# Patient Record
Sex: Female | Born: 1965 | Race: Black or African American | Hispanic: No | Marital: Married | Smoking: Never smoker
Health system: Southern US, Community
[De-identification: ages and names within clinical notes are randomized; demographics above are authoritative.]

---

## 2016-12-23 ENCOUNTER — Emergency Department (HOSPITAL_COMMUNITY): Payer: Worker's Compensation

## 2016-12-23 ENCOUNTER — Encounter (HOSPITAL_COMMUNITY): Payer: Self-pay | Admitting: *Deleted

## 2016-12-23 ENCOUNTER — Emergency Department (HOSPITAL_COMMUNITY)
Admission: EM | Admit: 2016-12-23 | Discharge: 2016-12-23 | Disposition: A | Discharge status: Home / Self Care | Payer: Worker's Compensation | Attending: Emergency Medicine | Admitting: Emergency Medicine

## 2016-12-23 DIAGNOSIS — Y92007 Garden or yard of unspecified non-institutional (private) residence as the place of occurrence of the external cause: Secondary | ICD-10-CM | POA: Insufficient documentation

## 2016-12-23 DIAGNOSIS — W0110XA Fall on same level from slipping, tripping and stumbling with subsequent striking against unspecified object, initial encounter: Secondary | ICD-10-CM | POA: Insufficient documentation

## 2016-12-23 DIAGNOSIS — Y939 Activity, unspecified: Secondary | ICD-10-CM | POA: Diagnosis not present

## 2016-12-23 DIAGNOSIS — S39012A Strain of muscle, fascia and tendon of lower back, initial encounter: Secondary | ICD-10-CM | POA: Diagnosis not present

## 2016-12-23 DIAGNOSIS — S161XXA Strain of muscle, fascia and tendon at neck level, initial encounter: Secondary | ICD-10-CM | POA: Insufficient documentation

## 2016-12-23 DIAGNOSIS — Y990 Civilian activity done for income or pay: Secondary | ICD-10-CM | POA: Diagnosis not present

## 2016-12-23 DIAGNOSIS — W19XXXA Unspecified fall, initial encounter: Secondary | ICD-10-CM

## 2016-12-23 LAB — POC URINE PREG, ED: PREG TEST UR: NEGATIVE

## 2016-12-23 MED ORDER — CYCLOBENZAPRINE HCL 10 MG PO TABS: ORAL_TABLET | ORAL | 0 refills | Status: DC

## 2016-12-23 MED ORDER — IBUPROFEN 600 MG PO TABS: 600.0000 mg | ORAL_TABLET | Freq: Four times a day (QID) | ORAL | 0 refills | Status: DC

## 2016-12-23 NOTE — ED Triage Notes (Signed)
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 Pt reports she fell backwards onto her left side. Denies LOC upon fall. Pt c/o soreness to left arm and left side.

## 2016-12-23 NOTE — ED Provider Notes (Signed)
[facility]-EMERGENCY DEPT Provider Note   CSN: [id number] Arrival date & time: 12/23/16  1135     History   Chief Complaint Chief Complaint  Patient presents with  . Fall    HPI Eileen Craig is a 51 y.o. female.  Patient is a 51 year old female who presents to the emergency department with a complaint of left neck and arm pain, and lower back pain. 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 She was able to break her fall with her arms extended. She noted however pain in her neck area in her left arm and in her lower back. The pain got worse during the night. Was not responding very well to conservative measures. She came to the emergency department today for additional evaluation and management. There was no loss of bowel or bladder function. No loss of balance Patient complains of some tinglingin the left arm, but she is not dropping objects and has very little difficulty using the left upper extremity.   The history is provided by the patient.  Fall  Pertinent negatives include no chest pain, no abdominal pain and no shortness of breath.    History reviewed. No pertinent past medical history.  There are no active problems to display for this patient.   History reviewed. No pertinent surgical history.  OB History    No data available       Home Medications    Prior to Admission medications   Not on File    Family History No family history on file.  Social History Social History  Substance Use Topics  . Smoking status: Never Smoker  . Smokeless tobacco: Never Used  . Alcohol use No     Allergies   Patient has no known allergies.   Review of Systems Review of Systems  Constitutional: Negative for activity change.       All ROS Neg except as noted in HPI  HENT: Negative for nosebleeds.   Eyes: Negative for photophobia and discharge.  Respiratory: Negative for cough,  shortness of breath and wheezing.   Cardiovascular: Negative for chest pain and palpitations.  Gastrointestinal: Negative for abdominal pain and blood in stool.  Genitourinary: Negative for dysuria, frequency and hematuria.  Musculoskeletal: Positive for arthralgias, back pain and neck pain.  Skin: Negative.   Neurological: Negative for dizziness, seizures and speech difficulty.  Psychiatric/Behavioral: Negative for confusion and hallucinations.     Physical Exam Updated Vital Signs BP 136/81 (BP Location: Right Arm)   Pulse 66   Temp 98.2 F (36.8 C) (Oral)   Resp 18   Ht  (1.6 m)   Wt 83.9 kg (185 lb)   LMP 12/02/2016   SpO2 98%   BMI 32.77 kg/m   Physical Exam  Constitutional: She is oriented to person, place, and time. She appears well-developed and well-nourished.  Non-toxic appearance.  HENT:  Head: Normocephalic.  Right Ear: Tympanic membrane and external ear normal.  Left Ear: Tympanic membrane and external ear normal.  Eyes: Pupils are equal, round, and reactive to light. EOM and lids are normal.  Neck: Normal range of motion. Neck supple. Carotid bruit is not present.  Cardiovascular: Normal rate, regular rhythm, normal heart sounds, intact distal pulses and normal pulses.   Pulmonary/Chest: Breath sounds normal. No respiratory distress.  Abdominal: Soft. Bowel sounds are normal. There is no tenderness. There is no guarding.  Musculoskeletal: Normal range  of motion.       Cervical back: She exhibits pain and spasm.       Lumbar back: She exhibits pain and spasm.       Hands: Soreness and tingling of the left upper ext. With ROM.  Lymphadenopathy:       Head (right side): No submandibular adenopathy present.       Head (left side): No submandibular adenopathy present.    She has no cervical adenopathy.  Neurological: She is alert and oriented to person, place, and time. She has normal strength. No cranial nerve deficit or sensory deficit.  Skin: Skin is  warm and dry.  Psychiatric: She has a normal mood and affect. Her speech is normal.  Nursing note and vitals reviewed.    ED Treatments / Results  Labs (all labs ordered are listed, but only abnormal results are displayed) Labs Reviewed  POC URINE PREG, ED    EKG  EKG Interpretation None       Radiology Dg Cervical Spine Complete  Result Date: 12/23/2016 CLINICAL DATA:  Back and arm pain.  Prior fall. EXAM: CERVICAL SPINE - COMPLETE 4+ VIEW COMPARISON:  No recent prior . FINDINGS: Diffuse degenerative change. No evidence fracture or dislocation . Name acute bony abnormality identified. IMPRESSION: Diffuse multilevel degenerative change. No acute bony abnormality identified. Electronically Signed   By: Maisie Fus  Register   On: 12/23/2016 14:12   Dg Lumbar Spine Complete  Result Date: 12/23/2016 CLINICAL DATA:  Status post fall at work with low back pain. EXAM: LUMBAR SPINE - COMPLETE 4+ VIEW COMPARISON:  None. FINDINGS: There is no evidence of lumbar spine fracture. There is scoliosis of spine. Minimal degenerative joint changes with anterior osteophytosis is identified at L3 and L4. Intervertebral disc spaces are maintained. IMPRESSION: No acute fracture or dislocation. Minimal degenerative joint changes of spine. Electronically Signed   By: Sherian Rein M.D.   On: 12/23/2016 14:12    Procedures Procedures (including critical care time)  Medications Ordered in ED Medications - No data to display   Initial Impression / Assessment and Plan / ED Course  I have reviewed the triage vital signs and the nursing notes.  Pertinent labs & imaging results that were available during my care of the patient were reviewed by me and considered in my medical decision making (see chart for details).       Final Clinical Impressions(s) / ED Diagnoses MDM Vital signs within normal limits. X-ray of the lumbar spine and cervical spine show degenerative changes, but no fracture and no  dislocation. There no gross neurologic deficits appreciated. Patient is ambulatory with minimal problem. There is no evidence for caudal equina or other emergent situations. Patient has been instructed of the findings of the exam as well as the findings of the x-rays. Patient will use ibuprofen 4 times a day. She will use Flexeril for spasm pain. We've discussed that this medication may cause drowsiness. We've also discussed the need to return for emergent or other changes.   Final diagnoses:  Fall, initial encounter  Strain of lumbar region, initial encounter  Strain of neck muscle, initial encounter    New Prescriptions New Prescriptions   CYCLOBENZAPRINE (FLEXERIL) 10 MG TABLET    1 at hs, or q6h prn spasm pain   IBUPROFEN (ADVIL,MOTRIN) 600 MG TABLET    Take 1 tablet (600 mg total) by mouth 4 (four) times daily.     Ivery Quale, PA-C 12/23/16 1451    Donnetta Hutching,  MD 12/24/16 1621

## 2016-12-23 NOTE — Discharge Instructions (Signed)
Your vital signs within normal limits. The x-rays of your cervical spine and lumbar spine revealed degenerative changes, but no fracture, and no dislocation. Your exam suggest muscle strain of your cervical spine and your lumbar area. Please use ibuprofen with breakfast, lunch, dinner, and at bedtime. Use Flexeril at bedtime if needed for spasm pain, or every 6 hours if needed for more severe pain. This medication may cause drowsiness. Please do not drink alcohol, operate machinery, or participate in activities requiring concentration when taking this medication.

## 2019-05-30 HISTORY — PX: KNEE SURGERY: SHX244

## 2019-09-27 ENCOUNTER — Emergency Department (HOSPITAL_COMMUNITY)
Admission: EM | Admit: 2019-09-27 | Discharge: 2019-09-27 | Disposition: A | Discharge status: Home / Self Care | Payer: Worker's Compensation | Attending: Emergency Medicine | Admitting: Emergency Medicine

## 2019-09-27 ENCOUNTER — Encounter (HOSPITAL_COMMUNITY): Payer: Self-pay | Admitting: Emergency Medicine

## 2019-09-27 ENCOUNTER — Emergency Department (HOSPITAL_COMMUNITY): Payer: Self-pay

## 2019-09-27 ENCOUNTER — Emergency Department (HOSPITAL_COMMUNITY): Payer: Worker's Compensation

## 2019-09-27 ENCOUNTER — Other Ambulatory Visit: Payer: Self-pay

## 2019-09-27 DIAGNOSIS — W010XXA Fall on same level from slipping, tripping and stumbling without subsequent striking against object, initial encounter: Secondary | ICD-10-CM | POA: Insufficient documentation

## 2019-09-27 DIAGNOSIS — S46911A Strain of unspecified muscle, fascia and tendon at shoulder and upper arm level, right arm, initial encounter: Secondary | ICD-10-CM | POA: Diagnosis not present

## 2019-09-27 DIAGNOSIS — Y9289 Other specified places as the place of occurrence of the external cause: Secondary | ICD-10-CM | POA: Insufficient documentation

## 2019-09-27 DIAGNOSIS — Y990 Civilian activity done for income or pay: Secondary | ICD-10-CM | POA: Diagnosis not present

## 2019-09-27 DIAGNOSIS — Y9389 Activity, other specified: Secondary | ICD-10-CM | POA: Insufficient documentation

## 2019-09-27 DIAGNOSIS — S4991XA Unspecified injury of right shoulder and upper arm, initial encounter: Secondary | ICD-10-CM | POA: Diagnosis present

## 2019-09-27 MED ORDER — ACETAMINOPHEN 325 MG PO TABS
650.0000 mg | ORAL_TABLET | Freq: Once | ORAL | Status: AC
  Administered 2019-09-27: 13:00:00 650 mg via ORAL
  Filled 2019-09-27: qty 2

## 2019-09-27 MED ORDER — IBUPROFEN 600 MG PO TABS: 600.0000 mg | ORAL_TABLET | Freq: Four times a day (QID) | ORAL | 0 refills | Status: DC | PRN

## 2019-09-27 NOTE — Discharge Instructions (Addendum)
Your xray and CT scan today are negative for shoulder fracture or other obvious injury, but does not completely eliminate the possibility of a  shoulder tendon or cartilage injury.  You may use the shoulder as tolerated (avoid any activity that triggers the pain).  Apply ice as much as possible for the next several days to help minimize pain and swelling. You may also use the sling for comfort as needed.

## 2019-09-27 NOTE — ED Notes (Signed)
Pt. With Ct 

## 2019-09-27 NOTE — ED Triage Notes (Signed)
Pt reports she fell at work this am around 0830, attempted to catch herself and now has right shoulder pain, employer sent her to be checked, denies LOC, hitting head, arm or wrist pain

## 2019-09-28 NOTE — ED Provider Notes (Signed)
[facility] EMERGENCY DEPARTMENT Provider Note   CSN: [id number] Arrival date & time: 09/27/19  1002     History Chief Complaint  Patient presents with  . 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  She endorses pain and 'tightness" of the shoulder with movement since the event.  She denies pain in her elbow, forearm or hand, also denies head injury or other complaints including arm or hand numbness or weakness.  She has had no tx prior to arrival.  The history is provided by the patient.       History reviewed. No pertinent past medical history.  There are no problems to display for this patient.   Past Surgical History:  Procedure Laterality Date  . KNEE SURGERY Right 05/2019     OB History   No obstetric history on file.     No family history on file.  Social History   Tobacco Use  . Smoking status: Never Smoker  . Smokeless tobacco: Never Used  Vaping Use  . Vaping Use: Never used  Substance Use Topics  . Alcohol use: No  . Drug use: No    Home Medications Prior to Admission medications   Medication Sig Start Date End Date Taking? Authorizing Provider  cyclobenzaprine (FLEXERIL) 10 MG tablet 1 at hs, or q6h prn spasm pain 12/23/16   Ivery Quale, PA-C  ibuprofen (ADVIL) 600 MG tablet Take 1 tablet (600 mg total) by mouth every 6 (six) hours as needed. 09/27/19   Burgess Amor, PA-C    Allergies    Patient has no known allergies.  Review of Systems   Review of Systems  Musculoskeletal: Positive for arthralgias. Negative for back pain, joint swelling, myalgias and neck pain.  Neurological: Negative for weakness and numbness.  All other systems reviewed and are negative.   Physical Exam Updated Vital Signs BP (!) 152/89 (BP Location: Left Arm)   Pulse  62   Temp 98.6 F (37 C) (Oral)   Resp 14   Ht 5\' 3"  (1.6 m)   Wt 86.2 kg   LMP 12/02/2016   SpO2 100%   BMI 33.66 kg/m   Physical Exam Constitutional:      Appearance: She is well-developed.  HENT:     Head: Atraumatic.  Cardiovascular:     Comments: Pulses equal bilaterally Musculoskeletal:        General: Tenderness present. No swelling or deformity.     Right shoulder: Tenderness present. No deformity, effusion or crepitus. Decreased range of motion. Normal strength. Normal pulse.     Right elbow: Normal.     Right forearm: Normal.     Right wrist: Normal.     Right hand: Normal.     Cervical back: Normal range of motion.     Comments: ttp along the right posterior shoulder joint, no effusion, no crepitus with ROM, no deformity. Neck and paracervical muscles nontender.  Skin:    General: Skin is warm and dry.  Neurological:     Mental Status: She is alert.     Sensory: No sensory deficit.     Deep Tendon Reflexes: Reflexes normal.     ED Results / Procedures / Treatments   Labs (all labs ordered are listed, but only abnormal results are displayed) Labs Reviewed -  No data to display  EKG None  Radiology DG Shoulder Right  Result Date: 09/27/2019 CLINICAL DATA:  Right shoulder pain with painful range of motion since a fall this morning. EXAM: RIGHT SHOULDER - 2+ VIEW COMPARISON:  None. FINDINGS: There is a focal area of irregularity of the posterior rim of the glenoid on one view only. The exam is otherwise normal. No dislocation. IMPRESSION: Focal area of irregularity of the posterior rim of the glenoid on one view only. This could represent a fracture. I recommend CT scan of the right shoulder for further evaluation. Electronically Signed   By: Francene Boyers M.D.   On: 09/27/2019 11:48   CT Shoulder Right Wo Contrast  Result Date: 09/27/2019 CLINICAL DATA:  Right shoulder pain, painful range of motion status post fall EXAM: CT OF THE UPPER RIGHT EXTREMITY  WITHOUT CONTRAST TECHNIQUE: Multidetector CT imaging of the upper right extremity was performed according to the standard protocol. COMPARISON:  X-ray 09/27/2019 FINDINGS: Bones/Joint/Cartilage No acute fracture or dislocation. Normal alignment. No joint effusion. 1.2 x 1.2 x 1 cm well-circumscribed lucent lesion in the superior anterior glenoid which likely reflects a small intraosseous ganglion. Mild arthropathy of the acromioclavicular joint. No significant arthropathy of the glenohumeral joint. Ligaments Ligaments are suboptimally evaluated by CT. Muscles and Tendons Muscles are normal.  No muscle atrophy. Soft tissue No fluid collection or hematoma.  No soft tissue mass. IMPRESSION: 1. No acute osseous injury of the right shoulder. 2. 1.2 x 1.2 x 1 cm well-circumscribed lucent lesion in the superior anterior glenoid which likely reflect a small intraosseous ganglion. Electronically Signed   By: Elige Ko   On: 09/27/2019 13:36    Procedures Procedures (including critical care time)  Medications Ordered in ED Medications  acetaminophen (TYLENOL) tablet 650 mg (650 mg Oral Given 09/27/19 1237)    ED Course  I have reviewed the triage vital signs and the nursing notes.  Pertinent labs & imaging results that were available during my care of the patient were reviewed by me and considered in my medical decision making (see chart for details).    MDM Rules/Calculators/A&P                          Plain film imaging suggestion fracture of the glenoid, CT imaging negative for acute fracture. Ganglion chronic finding.   Pt was placed in a sling for comfort.  Discussed ice, rest, ibuprofen. Plan f/u within 1 week (workers comp - occ med per employers recommendations). In the interim, pt given return to work with restrictions right arm until cleared by occupational medicine.  I suspect this is a soft tissue strain of her shoulder. Final Clinical Impression(s) / ED Diagnoses Final diagnoses:  Strain  of right shoulder, initial encounter    Rx / DC Orders ED Discharge Orders         Ordered    ibuprofen (ADVIL) 600 MG tablet  Every 6 hours PRN     Discontinue  Reprint     09/27/19 1409           Burgess Amor, PA-C 09/28/19 3500    Terald Sleeper, MD 09/28/19 1250

## 2021-11-14 IMAGING — CT CT SHOULDER*R* W/O CM
1 series · 8 of 14 positions shown, 10 images · non-contrast
Comparison: X-ray 09/27/2019

CLINICAL DATA: Right shoulder pain, painful range of motion status
post fall

EXAM:
CT OF THE UPPER RIGHT EXTREMITY WITHOUT CONTRAST
TECHNIQUE: Multidetector CT imaging of the upper right extremity was performed
according to the standard protocol.

[Series 7: ax st · axial · 0.30mm/px · z∈[+1312,+1470]mm · 8 of 113 slices shown, 10 images]
[im 9/113  soft-tissue]
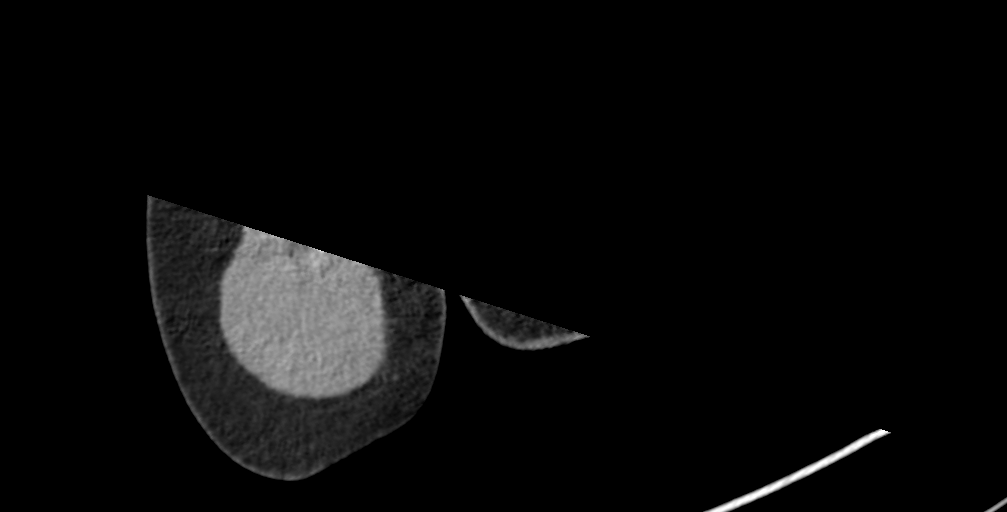
[im 9/113  bone]
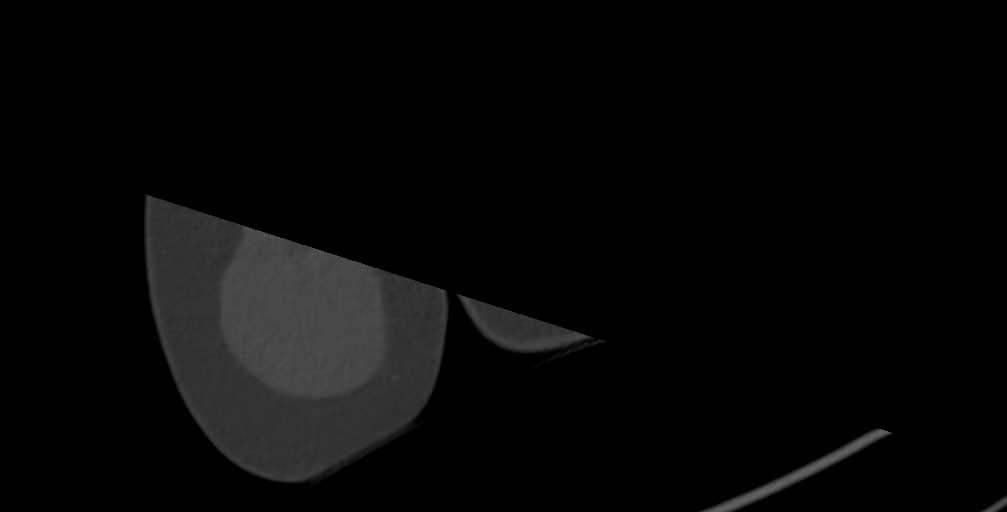
[im 26/113  bone]
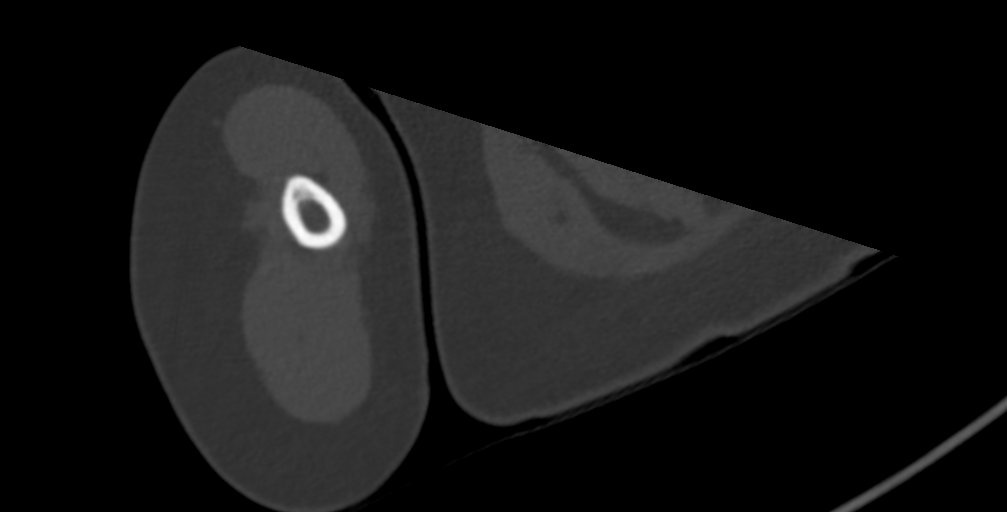
[im 35/113  bone]
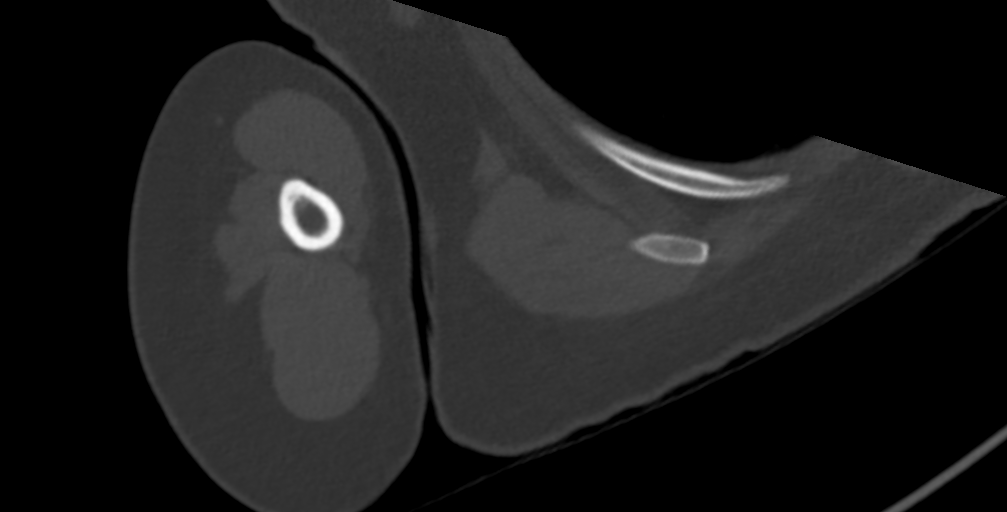
[im 52/113  bone]
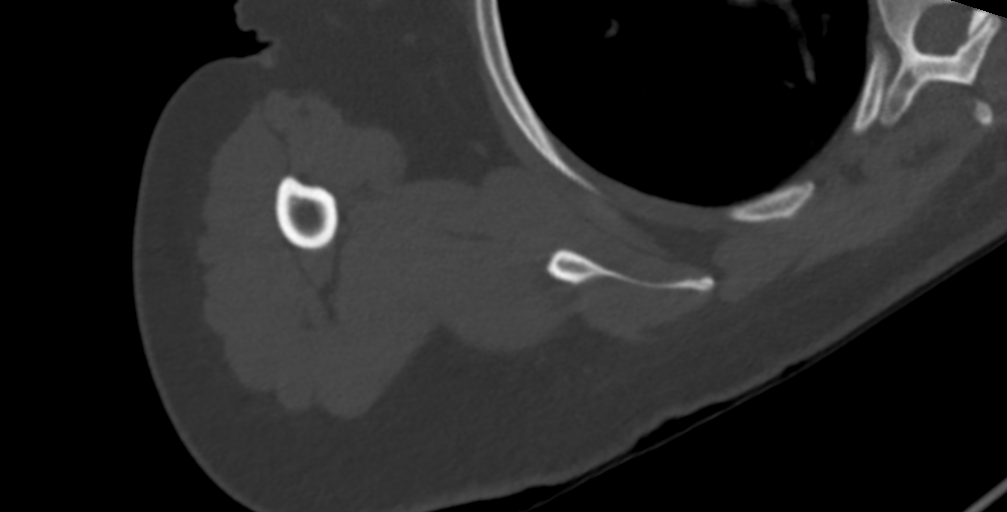
[im 61/113  soft-tissue]
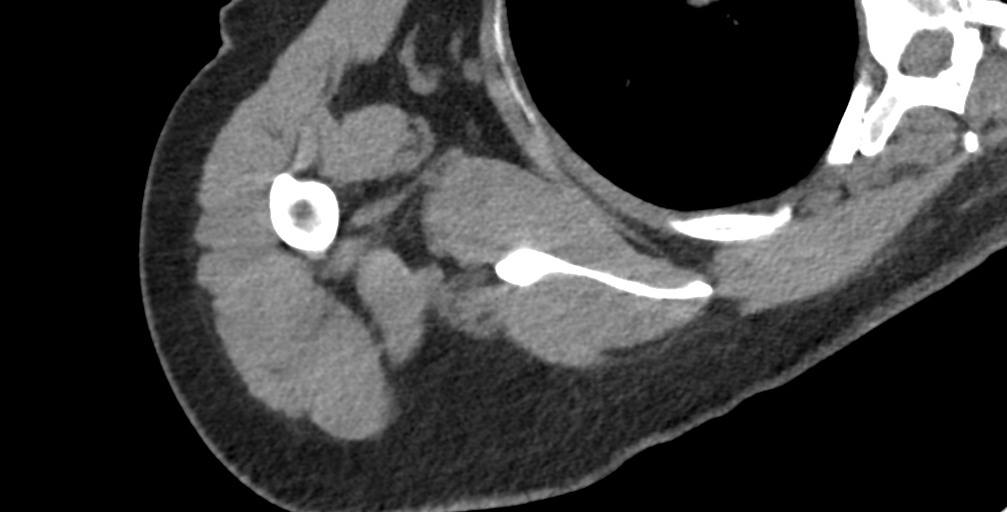
[im 61/113  bone]
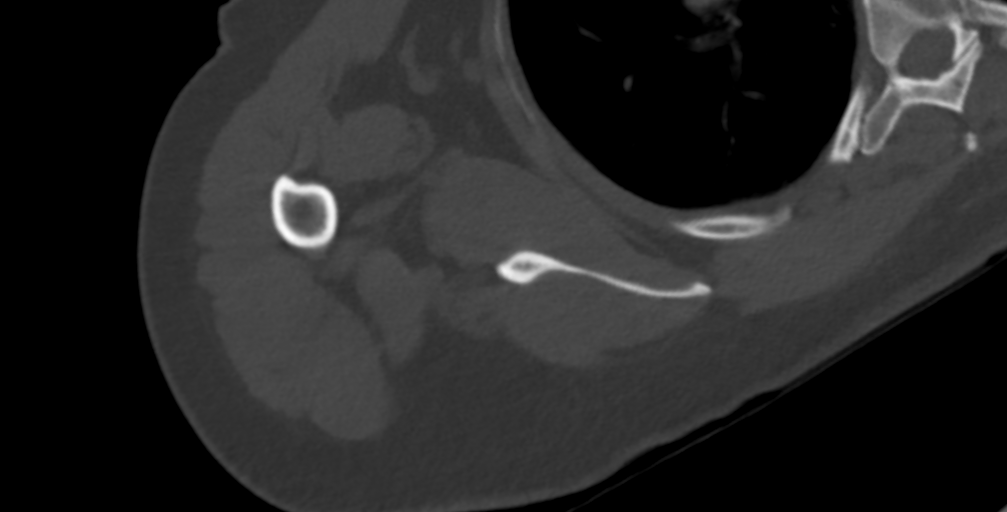
[im 78/113  bone]
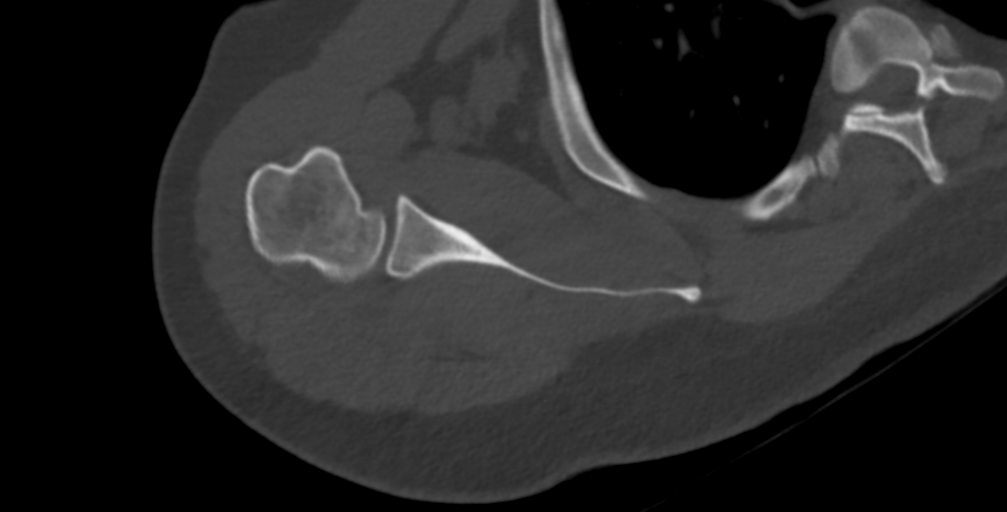
[im 87/113  bone]
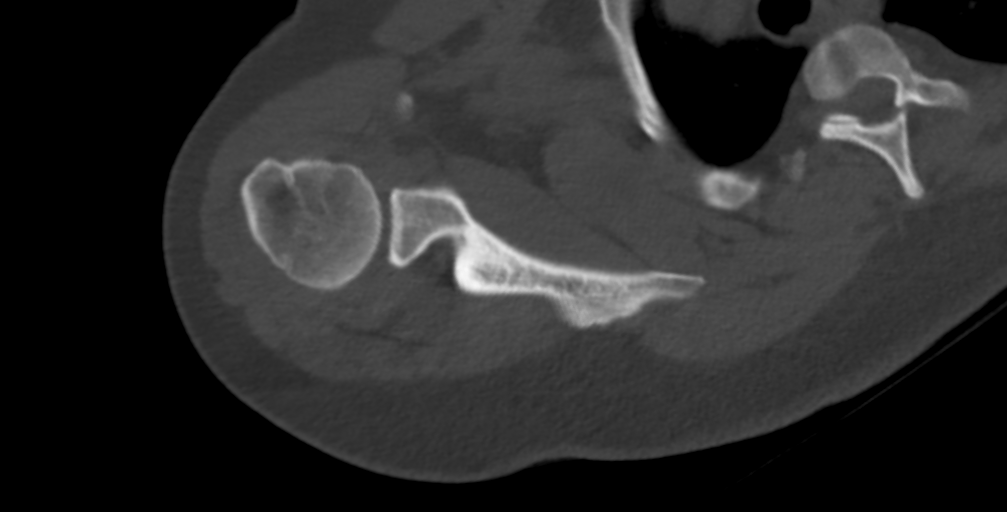
[im 104/113  bone]
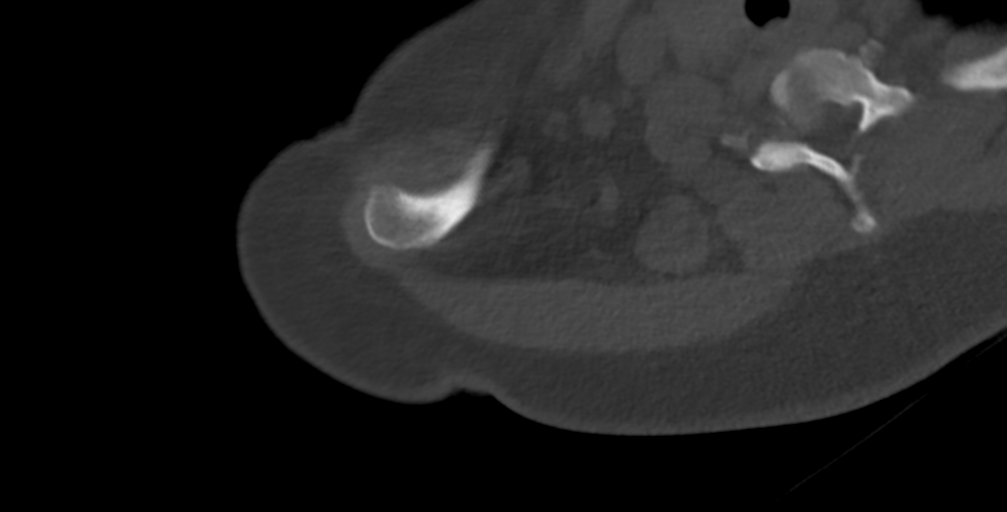

[8 of 14 positions shown; findings below may reference images not displayed]

FINDINGS: Bones/Joint/Cartilage

No acute fracture or dislocation. Normal alignment. No joint
effusion. 1.2 x 1.2 x 1 cm well-circumscribed lucent lesion in the
superior anterior glenoid which likely reflects a small intraosseous
ganglion. Mild arthropathy of the acromioclavicular joint. No
significant arthropathy of the glenohumeral joint.

Ligaments

Ligaments are suboptimally evaluated by CT.

Muscles and Tendons
Muscles are normal.  No muscle atrophy.

Soft tissue
No fluid collection or hematoma.  No soft tissue mass.
IMPRESSION: 1. No acute osseous injury of the right shoulder.
2. 1.2 x 1.2 x 1 cm well-circumscribed lucent lesion in the superior
anterior glenoid which likely reflect a small intraosseous ganglion.

## 2021-11-14 IMAGING — DX DG SHOULDER 2+V*R*
3 series · 3 of 3 positions shown · non-contrast
Comparison: None.

CLINICAL DATA: Right shoulder pain with painful range of motion
since a fall this morning.

EXAM:
RIGHT SHOULDER - 2+ VIEW

[shoulder grashey]
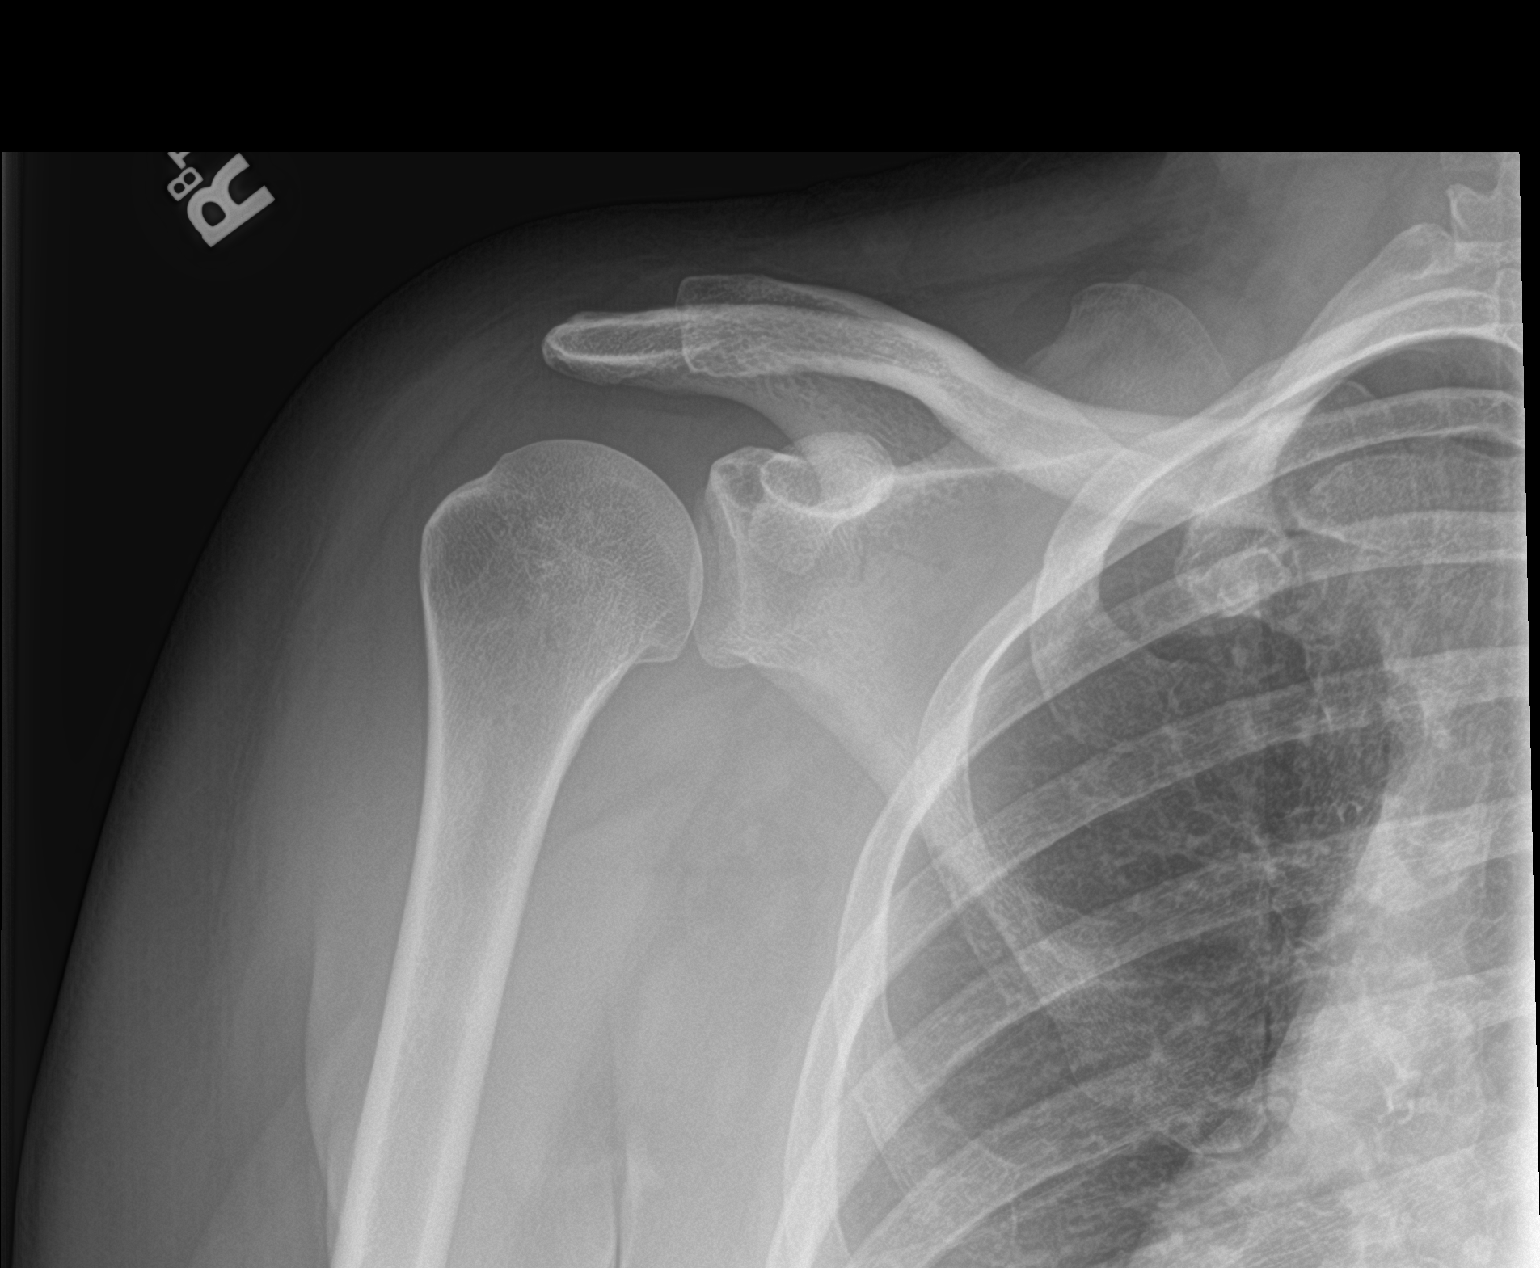

[shoulder y view]
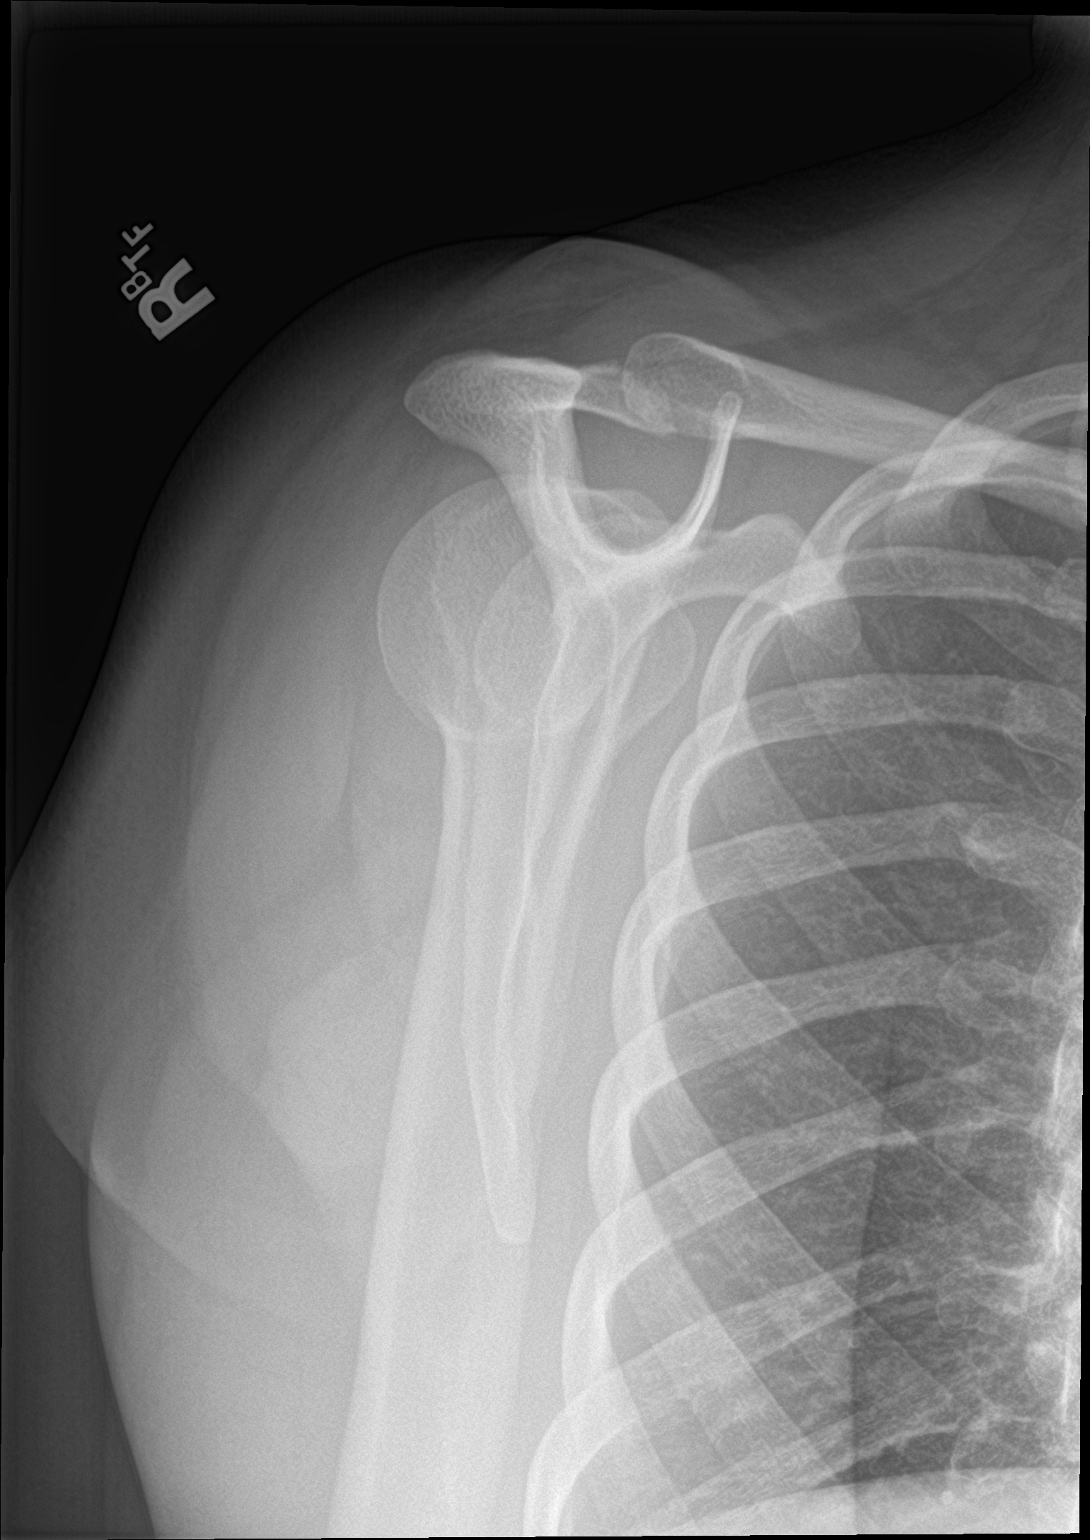

[shoulder axillary]
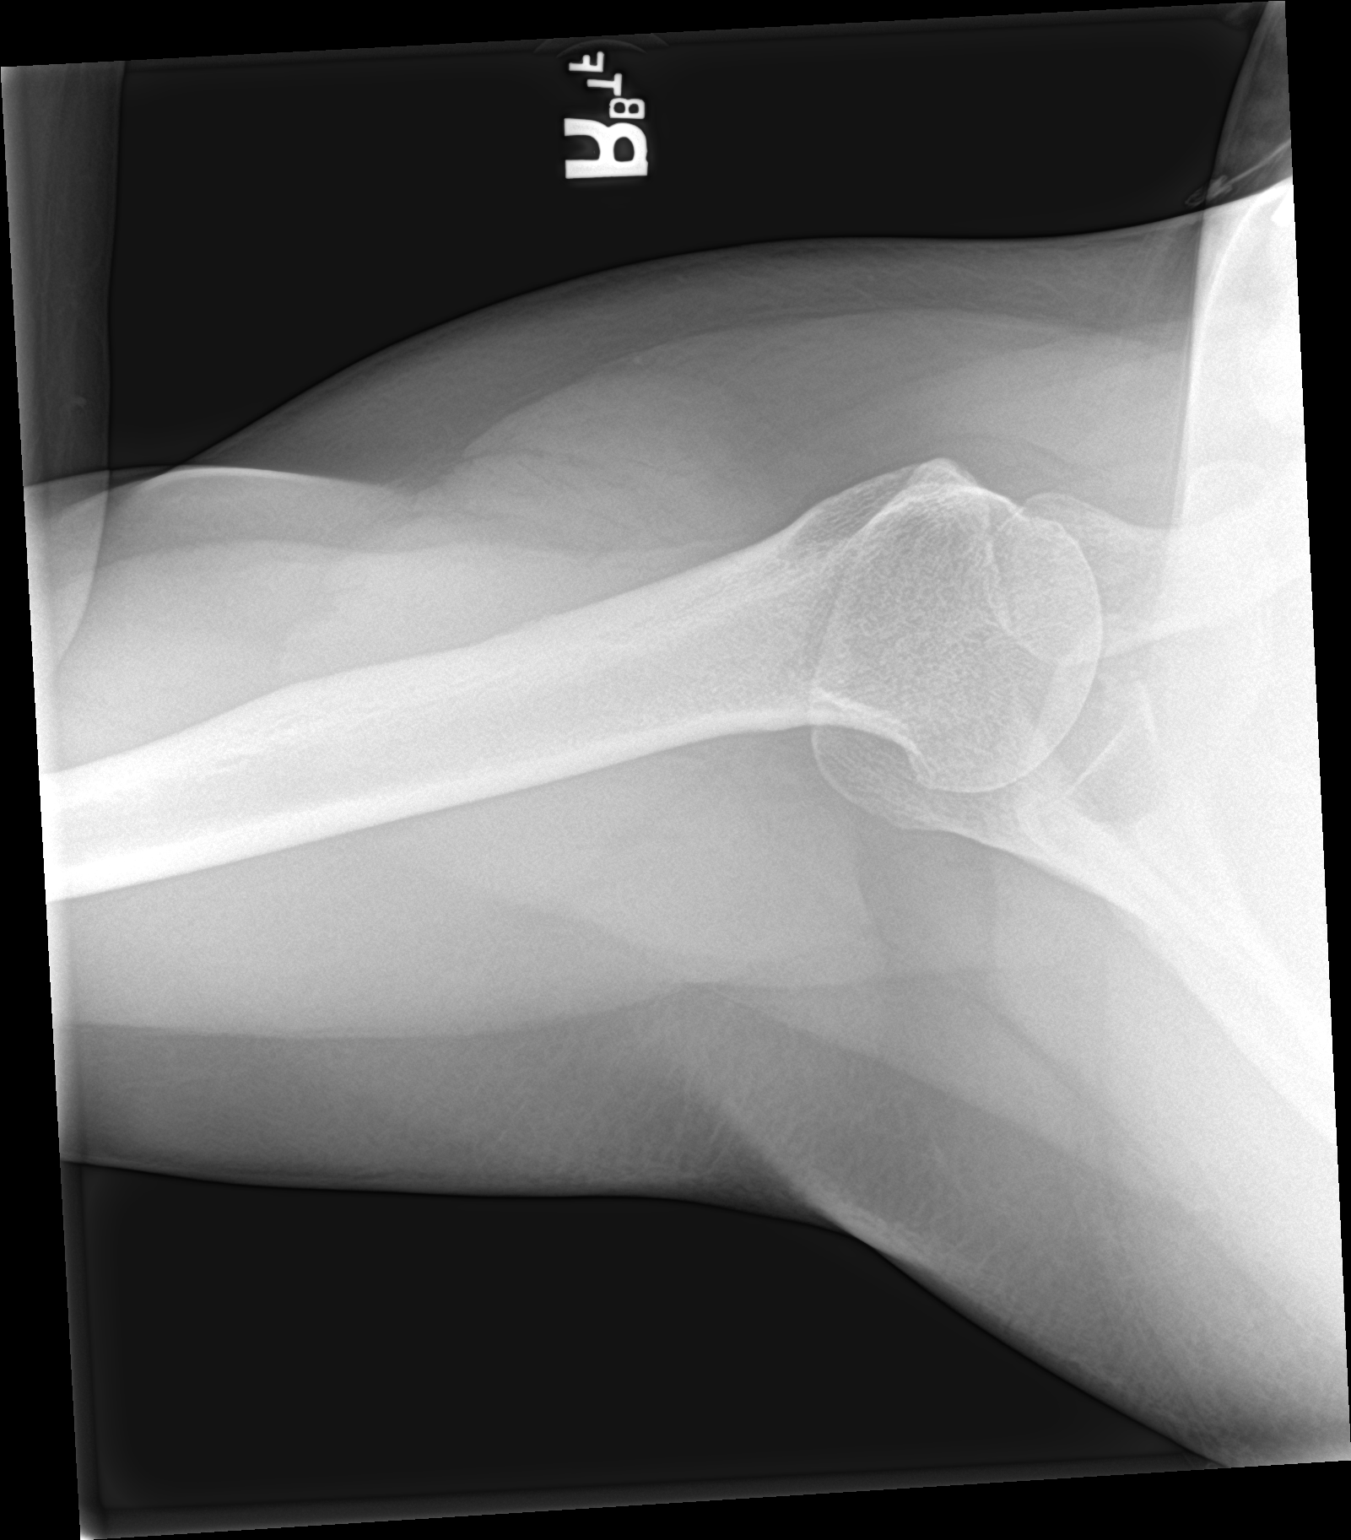

[3 of 3 positions shown; findings below may reference images not displayed]

FINDINGS: There is a focal area of irregularity of the posterior rim of the
glenoid on one view only. The exam is otherwise normal. No
dislocation.
IMPRESSION: Focal area of irregularity of the posterior rim of the glenoid on
one view only. This could represent a fracture. I recommend CT scan
of the right shoulder for further evaluation.

## 2022-04-21 ENCOUNTER — Emergency Department (HOSPITAL_COMMUNITY): Payer: Self-pay

## 2022-04-21 ENCOUNTER — Encounter (HOSPITAL_COMMUNITY): Payer: Self-pay | Admitting: Emergency Medicine

## 2022-04-21 ENCOUNTER — Emergency Department (HOSPITAL_COMMUNITY)
Admission: EM | Admit: 2022-04-21 | Discharge: 2022-04-21 | Disposition: A | Discharge status: Home / Self Care | Payer: No Typology Code available for payment source | Attending: Emergency Medicine | Admitting: Emergency Medicine

## 2022-04-21 ENCOUNTER — Other Ambulatory Visit: Payer: Self-pay

## 2022-04-21 DIAGNOSIS — Y9302 Activity, running: Secondary | ICD-10-CM | POA: Insufficient documentation

## 2022-04-21 DIAGNOSIS — W19XXXA Unspecified fall, initial encounter: Secondary | ICD-10-CM | POA: Insufficient documentation

## 2022-04-21 DIAGNOSIS — S8991XA Unspecified injury of right lower leg, initial encounter: Secondary | ICD-10-CM | POA: Insufficient documentation

## 2022-04-21 DIAGNOSIS — S93401A Sprain of unspecified ligament of right ankle, initial encounter: Secondary | ICD-10-CM | POA: Insufficient documentation

## 2022-04-21 MED ORDER — IBUPROFEN 800 MG PO TABS: 800.0000 mg | ORAL_TABLET | Freq: Three times a day (TID) | ORAL | 0 refills | Status: AC

## 2022-04-21 NOTE — Discharge Instructions (Signed)
Weight apply ice packs on and off to your knee and ankle.  Wear the ankle brace when walking or standing.  You may remove at bedtime and for bathing.  Follow-up with the orthopedic provider listed in 1 week if your symptoms are not improving.

## 2022-04-21 NOTE — ED Triage Notes (Signed)
Patient fell while running at work today striking her right knee and right ankle. Pt is ambulatory to triage.

## 2022-04-21 NOTE — ED Provider Notes (Signed)
[facility] Provider Note   CSN: [id number] Arrival date & time: 04/21/22  1059     History  Chief Complaint  Patient presents with   Leg Injury    Eileen Craig is a 57 y.o. female.  HPI     Eileen Craig is a 57 y.o. female who presents to the Emergency Department complaining of right knee and right ankle pain since a fall while running earlier today.  She states that she fell forward and went down on her right knee and believes that her right ankle rolled as she fell.  She is able to bear weight on it, but describes aching pain to the lateral ankle and anterior knee.  Reports history of prior knee surgery in 2021,  she denies head injury, LOC, neck or back pain    Home Medications Prior to Admission medications   Medication Sig Start Date End Date Taking? Authorizing Provider  cyclobenzaprine (FLEXERIL) 10 MG tablet 1 at hs, or q6h prn spasm pain 12/23/16   Lily Kocher, PA-C  ibuprofen (ADVIL) 600 MG tablet Take 1 tablet (600 mg total) by mouth every 6 (six) hours as needed. 09/27/19   Evalee Jefferson, PA-C      Allergies    Patient has no known allergies.    Review of Systems   Review of Systems  Constitutional:  Negative for chills and fever.  Respiratory:  Negative for cough and shortness of breath.   Cardiovascular:  Negative for chest pain.  Musculoskeletal:  Positive for arthralgias (right knee and right ankle pain). Negative for back pain, joint swelling and neck pain.  Skin:  Negative for color change and wound.  Neurological:  Negative for dizziness, syncope, weakness and headaches.  Psychiatric/Behavioral:  Negative for confusion.     Physical Exam Updated Vital Signs BP (!) 142/88   Pulse 64   Temp 97.7 F (36.5 C) (Oral)   Resp 16   Ht 5\' 3"  (1.6 m)   Wt 89.4 kg   LMP 12/02/2016   SpO2 97%   BMI 34.90 kg/m  Physical Exam Vitals and nursing note reviewed.  Constitutional:      General: She is  not in acute distress.    Appearance: Normal appearance. She is not toxic-appearing.  Cardiovascular:     Rate and Rhythm: Normal rate and regular rhythm.     Pulses: Normal pulses.  Pulmonary:     Effort: Pulmonary effort is normal.  Musculoskeletal:        General: Tenderness and signs of injury present. No swelling or deformity.     Cervical back: Normal range of motion. No tenderness.     Right knee: No swelling, deformity, effusion or crepitus. Normal range of motion. Tenderness present. Normal alignment.     Right lower leg: No edema.     Left lower leg: No edema.     Comments: Ttp of the anterior right knee.  No palpable effusion.  Patellar tendon appears intact.  No instability on valgus or varus stress.  Some tenderness of the lateral malleolus of right ankle.  No bony deformity or edema.  Lower leg non tender  Skin:    General: Skin is warm.     Capillary Refill: Capillary refill takes less than 2 seconds.     Findings: No bruising or erythema.  Neurological:     General: No focal deficit present.     Mental Status: She is alert.     Sensory:  No sensory deficit.     Motor: No weakness.     ED Results / Procedures / Treatments   Labs (all labs ordered are listed, but only abnormal results are displayed) Labs Reviewed - No data to display  EKG None  Radiology DG Knee 2 Views Right  Result Date: 04/21/2022 CLINICAL DATA:  Fall EXAM: RIGHT KNEE - 1-2 VIEW COMPARISON:  None Available. FINDINGS: No evidence of fracture, dislocation, or joint effusion. Tricompartmental osteoarthritis, moderate within the medial compartment. Soft tissues are unremarkable. IMPRESSION: Tricompartmental osteoarthritis, moderate within the medial compartment. No acute findings. Electronically Signed   By: Davina Poke D.O.   On: 04/21/2022 12:50   DG Ankle Complete Right  Result Date: 04/21/2022 CLINICAL DATA:  Ankle pain since falling this morning. EXAM: RIGHT ANKLE - COMPLETE 3+ VIEW  COMPARISON:  None Available. FINDINGS: The mineralization and alignment are normal. There is no evidence of acute fracture or dislocation. The joint spaces appear preserved. There is a small plantar calcaneal spur. No foreign body or obvious focal soft tissue swelling identified. IMPRESSION: No evidence of acute fracture or dislocation. Electronically Signed   By: Richardean Sale M.D.   On: 04/21/2022 12:45    Procedures Procedures    Medications Ordered in ED Medications - No data to display  ED Course/ Medical Decision Making/ A&P                             Medical Decision Making Pt here for evaluation of injuries sustained from a fall while running.  Complains of pain to right knee and ankle.  She is able to bear weight  On exam, she is tender to palpation of the lateral malleolus of the right ankle.  There is no bony deformity, instability or edema noted.  There is also some tenderness with range of motion of the anterior right knee.  No palpable effusion or obvious ligamentous instability.  I suspect injuries are musculoskeletal likely sprain.  Fracture, dislocation, ligament injury also considered.   Amount and/or Complexity of Data Reviewed Radiology: ordered.    Details: X-ray of the right ankle without evidence of acute fracture or dislocation.  X-ray of the right knee shows tricompartmental osteoarthritis, no acute findings Discussion of management or test interpretation with external provider(s): X-ray findings discussed with patient.  Knee brace and the ASO for the ankle ordered, patient declined knee brace but states she will take the ankle brace.  Ankle brace applied by nursing staff.  She is agreeable to symptomatic treatment and close outpatient follow-up with orthopedics if needed.  Risk Prescription drug management.           Final Clinical Impression(s) / ED Diagnoses Final diagnoses:  Sprain of right ankle, unspecified ligament, initial encounter  Injury  of right knee, initial encounter  Fall, initial encounter    Rx / DC Orders ED Discharge Orders     None         Kem Parkinson, Hershal Coria 04/22/22 1015    Milton Ferguson, MD 04/22/22 1017
